# Patient Record
Sex: Female | Born: 1950 | Race: White | Hispanic: No | Smoking: Never smoker
Health system: Southern US, Community
[De-identification: ages and names within clinical notes are randomized; demographics above are authoritative.]

## PROBLEM LIST (undated history)

## (undated) DIAGNOSIS — I10 Essential (primary) hypertension: Secondary | ICD-10-CM

---

## 1999-04-20 ENCOUNTER — Encounter: Payer: Self-pay | Admitting: Neurosurgery

## 1999-04-20 ENCOUNTER — Inpatient Hospital Stay (HOSPITAL_COMMUNITY): Admission: RE | Admit: 1999-04-20 | Discharge: 1999-04-21 | Payer: Self-pay | Admitting: Neurosurgery

## 1999-05-31 ENCOUNTER — Encounter: Payer: Self-pay | Admitting: Neurosurgery

## 1999-05-31 ENCOUNTER — Encounter: Admission: RE | Admit: 1999-05-31 | Discharge: 1999-05-31 | Payer: Self-pay | Admitting: Neurosurgery

## 2017-03-26 ENCOUNTER — Emergency Department
Admission: EM | Admit: 2017-03-26 | Discharge: 2017-03-26 | Disposition: A | Discharge status: Home / Self Care | Payer: BLUE CROSS/BLUE SHIELD | Attending: Emergency Medicine | Admitting: Emergency Medicine

## 2017-03-26 ENCOUNTER — Emergency Department: Payer: BLUE CROSS/BLUE SHIELD

## 2017-03-26 ENCOUNTER — Encounter: Payer: Self-pay | Admitting: Emergency Medicine

## 2017-03-26 DIAGNOSIS — N12 Tubulo-interstitial nephritis, not specified as acute or chronic: Secondary | ICD-10-CM

## 2017-03-26 DIAGNOSIS — N10 Acute tubulo-interstitial nephritis: Secondary | ICD-10-CM | POA: Diagnosis not present

## 2017-03-26 DIAGNOSIS — R1030 Lower abdominal pain, unspecified: Secondary | ICD-10-CM | POA: Diagnosis not present

## 2017-03-26 DIAGNOSIS — R112 Nausea with vomiting, unspecified: Secondary | ICD-10-CM | POA: Diagnosis present

## 2017-03-26 LAB — COMPREHENSIVE METABOLIC PANEL
ALBUMIN: 3 g/dL — AB (ref 3.5–5.0)
ALT: 24 U/L (ref 14–54)
AST: 23 U/L (ref 15–41)
Alkaline Phosphatase: 78 U/L (ref 38–126)
Anion gap: 11 (ref 5–15)
BILIRUBIN TOTAL: 0.8 mg/dL (ref 0.3–1.2)
BUN: 23 mg/dL — AB (ref 6–20)
CO2: 22 mmol/L (ref 22–32)
Calcium: 8.7 mg/dL — ABNORMAL LOW (ref 8.9–10.3)
Chloride: 100 mmol/L — ABNORMAL LOW (ref 101–111)
Creatinine, Ser: 0.93 mg/dL (ref 0.44–1.00)
GFR calc Af Amer: >60 mL/min (ref >60)
GFR calc non Af Amer: >60 mL/min (ref >60)
GLUCOSE: 178 mg/dL — AB (ref 65–99)
POTASSIUM: 3.6 mmol/L (ref 3.5–5.1)
Sodium: 133 mmol/L — ABNORMAL LOW (ref 135–145)
TOTAL PROTEIN: 7.1 g/dL (ref 6.5–8.1)

## 2017-03-26 LAB — CBC
HEMATOCRIT: 43.3 % (ref 35.0–47.0)
Hemoglobin: 14.5 g/dL (ref 12.0–16.0)
MCH: 30.7 pg (ref 26.0–34.0)
MCHC: 33.5 g/dL (ref 32.0–36.0)
MCV: 91.5 fL (ref 80.0–100.0)
PLATELETS: 276 10*3/uL (ref 150–440)
RBC: 4.73 MIL/uL (ref 3.80–5.20)
RDW: 12.1 % (ref 11.5–14.5)
WBC: 19.9 10*3/uL — ABNORMAL HIGH (ref 3.6–11.0)

## 2017-03-26 LAB — URINALYSIS, COMPLETE (UACMP) WITH MICROSCOPIC
Bilirubin Urine: NEGATIVE
GLUCOSE, UA: NEGATIVE mg/dL
Ketones, ur: 5 mg/dL — AB
NITRITE: NEGATIVE
PH: 6 (ref 5.0–8.0)
PROTEIN: 100 mg/dL — AB
Specific Gravity, Urine: 1.012 (ref 1.005–1.030)
Squamous Epithelial / LPF: NONE SEEN

## 2017-03-26 LAB — LIPASE, BLOOD: Lipase: 14 U/L (ref 11–51)

## 2017-03-26 MED ORDER — DEXTROSE 5 % IV SOLN
1.0000 g | Freq: Once | INTRAVENOUS | Status: AC
  Administered 2017-03-26: 1 g via INTRAVENOUS
  Filled 2017-03-26: qty 10

## 2017-03-26 MED ORDER — ONDANSETRON 4 MG PO TBDP
ORAL_TABLET | ORAL | Status: AC
  Administered 2017-03-26: 4 mg via ORAL
  Filled 2017-03-26: qty 1

## 2017-03-26 MED ORDER — DEXTROSE 5 % IV SOLN: 1.0000 g | Freq: Once | INTRAVENOUS | Status: DC

## 2017-03-26 MED ORDER — CEFTRIAXONE SODIUM IN DEXTROSE 20 MG/ML IV SOLN
INTRAVENOUS | Status: AC
  Filled 2017-03-26: qty 50

## 2017-03-26 MED ORDER — ONDANSETRON 4 MG PO TBDP
4.0000 mg | ORAL_TABLET | Freq: Once | ORAL | Status: AC
  Administered 2017-03-26: 4 mg via ORAL

## 2017-03-26 MED ORDER — IOPAMIDOL (ISOVUE-300) INJECTION 61%
100.0000 mL | Freq: Once | INTRAVENOUS | Status: AC | PRN
  Administered 2017-03-26: 100 mL via INTRAVENOUS
  Filled 2017-03-26: qty 100

## 2017-03-26 MED ORDER — ONDANSETRON 4 MG PO TBDP
4.0000 mg | ORAL_TABLET | Freq: Once | ORAL | Status: AC | PRN
  Administered 2017-03-26: 4 mg via ORAL
  Filled 2017-03-26: qty 1

## 2017-03-26 MED ORDER — ONDANSETRON HCL 4 MG/2ML IJ SOLN
4.0000 mg | Freq: Once | INTRAMUSCULAR | Status: AC
  Administered 2017-03-26: 4 mg via INTRAVENOUS
  Filled 2017-03-26: qty 2

## 2017-03-26 MED ORDER — CEFDINIR 300 MG PO CAPS: 300.0000 mg | ORAL_CAPSULE | Freq: Two times a day (BID) | ORAL | 0 refills | Status: DC

## 2017-03-26 MED ORDER — DEXTROSE 5 % IV SOLN
2.0000 g | Freq: Once | INTRAVENOUS | Status: DC
  Filled 2017-03-26: qty 2

## 2017-03-26 MED ORDER — SODIUM CHLORIDE 0.9 % IV BOLUS (SEPSIS)
1000.0000 mL | Freq: Once | INTRAVENOUS | Status: AC
  Administered 2017-03-26: 1000 mL via INTRAVENOUS

## 2017-03-26 MED ORDER — ONDANSETRON HCL 4 MG PO TABS: 4.0000 mg | ORAL_TABLET | Freq: Every day | ORAL | 0 refills | Status: DC | PRN

## 2017-03-26 NOTE — ED Notes (Addendum)
MD Schaevitz at bedside. 

## 2017-03-26 NOTE — ED Notes (Signed)
Eileen StanfordJenna RN states she called patient's nephew to pick up patient, patient will be DC to lobby

## 2017-03-26 NOTE — ED Provider Notes (Addendum)
[facility] Emergency Department Provider Note  ____________________________________________   First MD Initiated Contact with Patient 03/26/17 1857     (approximate)  I have reviewed the triage vital signs and the nursing notes.   HISTORY  Chief Complaint Abdominal Pain and Emesis   HPI Paige Howard is a 66 y.o. female who is presenting to the emergency department with 4 days of nausea vomiting.  She says that she occasionally feels a lower abdominal burning in and around the time that she vomits.  However, denies any lower abdominal pain.  Denies any burning with urination.  Says that she is also had a dry cough and intermittent fever.  Got her flu vaccine 1 month ago.  Denies any known sick contacts.  Not reporting any back pain.  History reviewed. No pertinent past medical history.  There are no active problems to display for this patient.   History reviewed. No pertinent surgical history.  Prior to Admission medications   Not on File    Allergies Patient has no known allergies.  No family history on file.  Social History Social History   Tobacco Use  . Smoking status: Never Smoker  . Smokeless tobacco: Never Used  Substance Use Topics  . Alcohol use: No    Frequency: Never  . Drug use: No    Review of Systems  Constitutional: No fever/chills Eyes: No visual changes. ENT: No sore throat. Cardiovascular: Denies chest pain. Respiratory: Denies shortness of breath. Gastrointestinal:   No diarrhea.  No constipation. Genitourinary: Negative for dysuria. Musculoskeletal: Negative for back pain. Skin: Negative for rash. Neurological: Negative for headaches, focal weakness or numbness.   ____________________________________________   PHYSICAL EXAM:  VITAL SIGNS: ED Triage Vitals  Enc Vitals Group     BP 03/26/17 1702 (!) 146/77     Pulse Rate 03/26/17 1702 (!) 106     Resp 03/26/17 1702 16     Temp 03/26/17 1702 100.1  F (37.8 C)     Temp Source 03/26/17 1702 Oral     SpO2 03/26/17 1702 94 %     Weight 03/26/17 1703 200 lb (90.7 kg)     Height 03/26/17 1703 5\' 7"  (1.702 m)     Head Circumference --      Peak Flow --      Pain Score 03/26/17 1702 10     Pain Loc --      Pain Edu? --      Excl. in GC? --     Constitutional: Alert and oriented. Well appearing and in no acute distress. Eyes: Conjunctivae are normal.  Head: Atraumatic. Nose: No congestion/rhinnorhea. Mouth/Throat: Mucous membranes are moist.  Neck: No stridor.   Cardiovascular: Normal rate, regular rhythm. Grossly normal heart sounds.   Respiratory: Normal respiratory effort.  No retractions. Lungs CTAB. Gastrointestinal: Soft and nontender. No distention.  Musculoskeletal: No lower extremity tenderness nor edema.  No joint effusions. Neurologic:  Normal speech and language. No gross focal neurologic deficits are appreciated. Skin:  Skin is warm, dry and intact. No rash noted. Psychiatric: Mood and affect are normal. Speech and behavior are normal.  ____________________________________________   LABS (all labs ordered are listed, but only abnormal results are displayed)  Labs Reviewed  COMPREHENSIVE METABOLIC PANEL - Abnormal; Notable for the following components:      Result Value   Sodium 133 (*)    Chloride 100 (*)    Glucose, Bld 178 (*)    BUN 23 (*)  Calcium 8.7 (*)    Albumin 3.0 (*)    All other components within normal limits  CBC - Abnormal; Notable for the following components:   WBC 19.9 (*)    All other components within normal limits  URINALYSIS, COMPLETE (UACMP) WITH MICROSCOPIC - Abnormal; Notable for the following components:   Color, Urine YELLOW (*)    APPearance HAZY (*)    Hgb urine dipstick MODERATE (*)    Ketones, ur 5 (*)    Protein, ur 100 (*)    Leukocytes, UA MODERATE (*)    Bacteria, UA MANY (*)    All other components within normal limits  URINE CULTURE  LIPASE, BLOOD    ____________________________________________  EKG   ____________________________________________  RADIOLOGY  CAT scan with radiographic evidence of right-sided pyelonephritis. ____________________________________________   PROCEDURES  Procedure(s) performed:   Procedures  Critical Care performed:   ____________________________________________   INITIAL IMPRESSION / ASSESSMENT AND PLAN / ED COURSE  Pertinent labs & imaging results that were available during my care of the patient were reviewed by me and considered in my medical decision making (see chart for details).  DDX: Viral syndrome, viral gastroenteritis, dehydration  As part of my medical decision making, I reviewed the following data within the electronic MEDICAL RECORD NUMBER Old chart reviewed  Patient without any lower abdominal tenderness to palpation.  She repeatedly denies any lower abdominal tenderness as I palpated multiple times to both superficial middle and deep palpation.  At this point given her exam it seems unlikely that the patient will be having an acute surgical intra-abdominal issue even given her elevated white blood cell count.  Possible reactive white blood cell count secondary to this viral issue.    ----------------------------------------- 10:43 PM on 03/26/2017 -----------------------------------------  Patient now tolerating ginger ale.  She will receive a dose of IV ceftriaxone in the emergency department and be discharged home with p.o. Cefdinir as well as Zofran.  Patient knows to return for any worsening or concerning symptoms.  She is understanding of the diagnosis as well as the treatment plan willing to comply.  She is unconvinced that this is all from urinary tract infection.  It is possible that she may have a viral infection as well.  She is denying any right flank pain.  No CVA tenderness to palpation.  ____________________________________________   FINAL CLINICAL IMPRESSION(S)  / ED DIAGNOSES  Pyelonephritis.  Nausea and vomiting.    NEW MEDICATIONS STARTED DURING THIS VISIT:  This SmartLink is deprecated. Use AVSMEDLIST instead to display the medication list for a patient.   Note:  This document was prepared using Dragon voice recognition software and may include unintentional dictation errors.     Myrna Blazer, MD 03/26/17 2245  Temperature retaken and is 98.5.  Patient is nontoxic-appearing.  I feel that she is appropriate for home treatment.    Myrna Blazer, MD 03/26/17 [id number]

## 2017-03-26 NOTE — ED Triage Notes (Signed)
Pt states that since Thursday she has been having N/V/ fever, chills, body aches, headache. Pt denies any sick contacts. Pt states that she is now having bilateral lower abdominal "burning". Pt in NAD at this time.

## 2017-03-26 NOTE — ED Notes (Signed)
Patient given PO fluids and crackers and encouraged to eat/drink for PO challenge per MD Schaevitz. RN will continue to monitor.

## 2017-03-29 LAB — URINE CULTURE: Culture: >=100000 — AB

## 2017-05-18 ENCOUNTER — Other Ambulatory Visit: Payer: Self-pay | Admitting: Family Medicine

## 2017-05-18 DIAGNOSIS — Z1231 Encounter for screening mammogram for malignant neoplasm of breast: Secondary | ICD-10-CM

## 2017-06-08 ENCOUNTER — Other Ambulatory Visit: Payer: Self-pay | Admitting: Family Medicine

## 2017-06-08 ENCOUNTER — Ambulatory Visit
Admission: RE | Admit: 2017-06-08 | Discharge: 2017-06-08 | Disposition: A | Discharge status: Home / Self Care | Payer: BLUE CROSS/BLUE SHIELD | Source: Ambulatory Visit | Attending: Family Medicine | Admitting: Family Medicine

## 2017-06-08 DIAGNOSIS — Z1231 Encounter for screening mammogram for malignant neoplasm of breast: Secondary | ICD-10-CM

## 2018-06-18 ENCOUNTER — Other Ambulatory Visit: Payer: Self-pay | Admitting: Family Medicine

## 2018-06-18 DIAGNOSIS — Z1231 Encounter for screening mammogram for malignant neoplasm of breast: Secondary | ICD-10-CM

## 2018-07-03 ENCOUNTER — Ambulatory Visit
Admission: RE | Admit: 2018-07-03 | Discharge: 2018-07-03 | Disposition: A | Discharge status: Home / Self Care | Payer: BLUE CROSS/BLUE SHIELD | Source: Ambulatory Visit | Attending: Family Medicine | Admitting: Family Medicine

## 2018-07-03 DIAGNOSIS — Z1231 Encounter for screening mammogram for malignant neoplasm of breast: Secondary | ICD-10-CM | POA: Diagnosis not present

## 2018-08-29 ENCOUNTER — Ambulatory Visit: Admit: 2018-08-29 | Payer: BLUE CROSS/BLUE SHIELD | Admitting: Ophthalmology

## 2018-08-29 SURGERY — PHACOEMULSIFICATION, CATARACT, WITH IOL INSERTION
Anesthesia: Choice | Laterality: Right

## 2018-10-25 ENCOUNTER — Encounter: Payer: Self-pay | Admitting: *Deleted

## 2018-10-25 ENCOUNTER — Other Ambulatory Visit: Payer: Self-pay

## 2018-10-25 NOTE — Discharge Instructions (Signed)

## 2018-10-26 ENCOUNTER — Encounter
Admission: RE | Admit: 2018-10-26 | Discharge: 2018-10-26 | Disposition: A | Discharge status: Home / Self Care | Payer: Medicare Other | Source: Ambulatory Visit | Attending: Ophthalmology | Admitting: Ophthalmology

## 2018-10-26 DIAGNOSIS — Z01812 Encounter for preprocedural laboratory examination: Secondary | ICD-10-CM | POA: Diagnosis not present

## 2018-10-26 DIAGNOSIS — Z1159 Encounter for screening for other viral diseases: Secondary | ICD-10-CM | POA: Insufficient documentation

## 2018-10-27 LAB — NOVEL CORONAVIRUS, NAA (HOSP ORDER, SEND-OUT TO REF LAB; TAT 18-24 HRS): SARS-CoV-2, NAA: NOT DETECTED

## 2018-10-31 ENCOUNTER — Ambulatory Visit: Payer: BLUE CROSS/BLUE SHIELD | Admitting: Anesthesiology

## 2018-10-31 ENCOUNTER — Encounter
Admission: RE | Disposition: A | Discharge status: Home / Self Care | Payer: Self-pay | Source: Home / Self Care | Attending: Ophthalmology

## 2018-10-31 ENCOUNTER — Ambulatory Visit
Admission: RE | Admit: 2018-10-31 | Discharge: 2018-10-31 | Disposition: A | Discharge status: Home / Self Care | Payer: BLUE CROSS/BLUE SHIELD | Attending: Ophthalmology | Admitting: Ophthalmology

## 2018-10-31 DIAGNOSIS — H2511 Age-related nuclear cataract, right eye: Secondary | ICD-10-CM | POA: Insufficient documentation

## 2018-10-31 DIAGNOSIS — F329 Major depressive disorder, single episode, unspecified: Secondary | ICD-10-CM | POA: Insufficient documentation

## 2018-10-31 DIAGNOSIS — F419 Anxiety disorder, unspecified: Secondary | ICD-10-CM | POA: Insufficient documentation

## 2018-10-31 DIAGNOSIS — I10 Essential (primary) hypertension: Secondary | ICD-10-CM | POA: Insufficient documentation

## 2018-10-31 DIAGNOSIS — Z79899 Other long term (current) drug therapy: Secondary | ICD-10-CM | POA: Insufficient documentation

## 2018-10-31 DIAGNOSIS — K219 Gastro-esophageal reflux disease without esophagitis: Secondary | ICD-10-CM | POA: Diagnosis not present

## 2018-10-31 HISTORY — DX: Essential (primary) hypertension: I10

## 2018-10-31 HISTORY — PX: CATARACT EXTRACTION W/PHACO: SHX586

## 2018-10-31 SURGERY — PHACOEMULSIFICATION, CATARACT, WITH IOL INSERTION
Anesthesia: Monitor Anesthesia Care | Site: Eye | Laterality: Right

## 2018-10-31 MED ORDER — MOXIFLOXACIN HCL 0.5 % OP SOLN
1.0000 [drp] | OPHTHALMIC | Status: DC | PRN
  Administered 2018-10-31 (×3): 1 [drp] via OPHTHALMIC

## 2018-10-31 MED ORDER — ARMC OPHTHALMIC DILATING DROPS
1.0000 "application " | OPHTHALMIC | Status: DC | PRN
  Administered 2018-10-31 (×3): 1 via OPHTHALMIC

## 2018-10-31 MED ORDER — ACETAMINOPHEN 325 MG PO TABS: 325.0000 mg | ORAL_TABLET | Freq: Once | ORAL | Status: DC

## 2018-10-31 MED ORDER — FENTANYL CITRATE (PF) 100 MCG/2ML IJ SOLN
INTRAMUSCULAR | Status: DC | PRN
  Administered 2018-10-31: 50 ug via INTRAVENOUS

## 2018-10-31 MED ORDER — CEFUROXIME OPHTHALMIC INJECTION 1 MG/0.1 ML
INJECTION | OPHTHALMIC | Status: DC | PRN
  Administered 2018-10-31: 0.1 mL via INTRACAMERAL

## 2018-10-31 MED ORDER — BRIMONIDINE TARTRATE-TIMOLOL 0.2-0.5 % OP SOLN
OPHTHALMIC | Status: DC | PRN
  Administered 2018-10-31: 1 [drp] via OPHTHALMIC

## 2018-10-31 MED ORDER — EPINEPHRINE PF 1 MG/ML IJ SOLN
INTRAOCULAR | Status: DC | PRN
  Administered 2018-10-31: 70 mL via OPHTHALMIC

## 2018-10-31 MED ORDER — MIDAZOLAM HCL 2 MG/2ML IJ SOLN
INTRAMUSCULAR | Status: DC | PRN
  Administered 2018-10-31: 2 mg via INTRAVENOUS
  Administered 2018-10-31: 1 mg via INTRAVENOUS

## 2018-10-31 MED ORDER — LIDOCAINE HCL (PF) 2 % IJ SOLN
INTRAOCULAR | Status: DC | PRN
  Administered 2018-10-31: 1 mL

## 2018-10-31 MED ORDER — LACTATED RINGERS IV SOLN: 10.0000 mL/h | INTRAVENOUS | Status: DC

## 2018-10-31 MED ORDER — NA HYALUR & NA CHOND-NA HYALUR 0.4-0.35 ML IO KIT
PACK | INTRAOCULAR | Status: DC | PRN
  Administered 2018-10-31: 1 mL via INTRAOCULAR

## 2018-10-31 MED ORDER — TETRACAINE HCL 0.5 % OP SOLN
1.0000 [drp] | OPHTHALMIC | Status: DC | PRN
  Administered 2018-10-31 (×3): 1 [drp] via OPHTHALMIC

## 2018-10-31 MED ORDER — ACETAMINOPHEN 160 MG/5ML PO SOLN: 325.0000 mg | Freq: Once | ORAL | Status: DC

## 2018-10-31 SURGICAL SUPPLY — 19 items
CANNULA ANT/CHMB 27GA (MISCELLANEOUS) ×2 IMPLANT
GLOVE SURG LX 7.5 STRW (GLOVE) ×1
GLOVE SURG LX STRL 7.5 STRW (GLOVE) ×1 IMPLANT
GLOVE SURG TRIUMPH 8.0 PF LTX (GLOVE) ×2 IMPLANT
GOWN STRL REUS W/ TWL LRG LVL3 (GOWN DISPOSABLE) ×2 IMPLANT
GOWN STRL REUS W/TWL LRG LVL3 (GOWN DISPOSABLE) ×2
LENS IOL ACRSF IQ ULTRA 18.0 (Intraocular Lens) ×1 IMPLANT
LENS IOL ACRYSOF IQ 18.0 (Intraocular Lens) ×2 IMPLANT
MARKER SKIN DUAL TIP RULER LAB (MISCELLANEOUS) ×2 IMPLANT
NEEDLE FILTER BLUNT 18X 1/2SAF (NEEDLE) ×1
NEEDLE FILTER BLUNT 18X1 1/2 (NEEDLE) ×1 IMPLANT
PACK CATARACT BRASINGTON (MISCELLANEOUS) ×2 IMPLANT
PACK EYE AFTER SURG (MISCELLANEOUS) ×2 IMPLANT
PACK OPTHALMIC (MISCELLANEOUS) ×2 IMPLANT
SYR 3ML LL SCALE MARK (SYRINGE) ×2 IMPLANT
SYR 5ML LL (SYRINGE) ×2 IMPLANT
SYR TB 1ML LUER SLIP (SYRINGE) ×2 IMPLANT
WATER STERILE IRR 500ML POUR (IV SOLUTION) ×2 IMPLANT
WIPE NON LINTING 3.25X3.25 (MISCELLANEOUS) ×2 IMPLANT

## 2018-10-31 NOTE — Anesthesia Postprocedure Evaluation (Signed)
Anesthesia Post Note  Patient: Paige Howard  Procedure(s) Performed: CATARACT EXTRACTION PHACO AND INTRAOCULAR LENS PLACEMENT (IOC)  RIGHT (Right Eye)  Patient location during evaluation: PACU Anesthesia Type: MAC Level of consciousness: awake and alert and oriented Pain management: satisfactory to patient Vital Signs Assessment: post-procedure vital signs reviewed and stable Respiratory status: spontaneous breathing, nonlabored ventilation and respiratory function stable Cardiovascular status: blood pressure returned to baseline and stable Postop Assessment: Adequate PO intake and No signs of nausea or vomiting Anesthetic complications: no    Raliegh Ip

## 2018-10-31 NOTE — H&P (Signed)

## 2018-10-31 NOTE — Transfer of Care (Signed)
Immediate Anesthesia Transfer of Care Note  Patient: Paige Howard  Procedure(s) Performed: CATARACT EXTRACTION PHACO AND INTRAOCULAR LENS PLACEMENT (IOC)  RIGHT (Right Eye)  Patient Location: PACU  Anesthesia Type: MAC  Level of Consciousness: awake, alert  and patient cooperative  Airway and Oxygen Therapy: Patient Spontanous Breathing and Patient connected to supplemental oxygen  Post-op Assessment: Post-op Vital signs reviewed, Patient's Cardiovascular Status Stable, Respiratory Function Stable, Patent Airway and No signs of Nausea or vomiting  Post-op Vital Signs: Reviewed and stable  Complications: No apparent anesthesia complications

## 2018-10-31 NOTE — Anesthesia Preprocedure Evaluation (Signed)
Anesthesia Evaluation  Patient identified by MRN, date of birth, ID band Patient awake    Reviewed: Allergy & Precautions, H&P , NPO status , Patient's Chart, lab work & pertinent test results  Airway Mallampati: II  TM Distance: >3 FB Neck ROM: full    Dental no notable dental hx.    Pulmonary    Pulmonary exam normal breath sounds clear to auscultation       Cardiovascular hypertension, Normal cardiovascular exam Rhythm:regular Rate:Normal     Neuro/Psych    GI/Hepatic   Endo/Other    Renal/GU      Musculoskeletal   Abdominal   Peds  Hematology   Anesthesia Other Findings   Reproductive/Obstetrics                             Anesthesia Physical Anesthesia Plan  ASA: II  Anesthesia Plan: MAC   Post-op Pain Management:    Induction:   PONV Risk Score and Plan: 2 and Midazolam  Airway Management Planned:   Additional Equipment:   Intra-op Plan:   Post-operative Plan:   Informed Consent: I have reviewed the patients History and Physical, chart, labs and discussed the procedure including the risks, benefits and alternatives for the proposed anesthesia with the patient or authorized representative who has indicated his/her understanding and acceptance.       Plan Discussed with: CRNA  Anesthesia Plan Comments:         Anesthesia Quick Evaluation

## 2018-10-31 NOTE — Anesthesia Procedure Notes (Signed)
Procedure Name: MAC Date/Time: 10/31/2018 8:30 AM Performed by: Janna Arch, CRNA Pre-anesthesia Checklist: Patient identified, Emergency Drugs available, Suction available, Timeout performed and Patient being monitored Patient Re-evaluated:Patient Re-evaluated prior to induction Oxygen Delivery Method: Nasal cannula Placement Confirmation: positive ETCO2

## 2018-10-31 NOTE — Op Note (Signed)
LOCATION:  [facility]   PREOPERATIVE DIAGNOSIS:    Nuclear sclerotic cataract right eye. H25.11   POSTOPERATIVE DIAGNOSIS:  Nuclear sclerotic cataract right eye.     PROCEDURE:  Phacoemusification with posterior chamber intraocular lens placement of the right eye   LENS:   Implant Name Type Inv. Item Serial No. Manufacturer Lot No. LRB No. Used  LENS IOL ACRYSOF IQ 18.0 - [id number] Intraocular Lens LENS IOL ACRYSOF IQ 18.0 [id number] ALCON  Right 1        ULTRASOUND TIME: 1/8 % of 1 minutes, 39 seconds.  CDE 18.1   SURGEON:  Wyonia Hough, MD   ANESTHESIA:  Topical with tetracaine drops and 2% Xylocaine jelly, augmented with 1% preservative-free intracameral lidocaine.    COMPLICATIONS:  None.   DESCRIPTION OF PROCEDURE:  The patient was identified in the holding room and transported to the operating room and placed in the supine position under the operating microscope.  The right eye was identified as the operative eye and it was prepped and draped in the usual sterile ophthalmic fashion.   A 1 millimeter clear-corneal paracentesis was made at the 12:00 position.  0.5 ml of preservative-free 1% lidocaine was injected into the anterior chamber. The anterior chamber was filled with Viscoat viscoelastic.  A 2.4 millimeter keratome was used to make a near-clear corneal incision at the 9:00 position.  A curvilinear capsulorrhexis was made with a cystotome and capsulorrhexis forceps.  Balanced salt solution was used to hydrodissect and hydrodelineate the nucleus.   Phacoemulsification was then used in stop and chop fashion to remove the lens nucleus and epinucleus.  The remaining cortex was then removed using the irrigation and aspiration handpiece. Provisc was then placed into the capsular bag to distend it for lens placement.  A lens was then injected into the capsular bag.  The remaining viscoelastic was aspirated.   Wounds were hydrated with balanced salt solution.   The anterior chamber was inflated to a physiologic pressure with balanced salt solution.  No wound leaks were noted. Cefuroxime 0.1 ml of a 10mg /ml solution was injected into the anterior chamber for a dose of 1 mg of intracameral antibiotic at the completion of the case.   Timolol and Brimonidine drops were applied to the eye.  The patient was taken to the recovery room in stable condition without complications of anesthesia or surgery.   Jamaurion Slemmer 10/31/2018, 8:52 AM

## 2018-11-01 ENCOUNTER — Encounter: Payer: Self-pay | Admitting: Ophthalmology

## 2018-11-21 ENCOUNTER — Encounter: Payer: Self-pay | Admitting: *Deleted

## 2018-11-21 ENCOUNTER — Other Ambulatory Visit: Payer: Self-pay

## 2018-11-23 ENCOUNTER — Other Ambulatory Visit
Admission: RE | Admit: 2018-11-23 | Discharge: 2018-11-23 | Disposition: A | Discharge status: Home / Self Care | Payer: BC Managed Care – PPO | Source: Ambulatory Visit | Attending: Ophthalmology | Admitting: Ophthalmology

## 2018-11-23 ENCOUNTER — Other Ambulatory Visit: Payer: Self-pay

## 2018-11-23 DIAGNOSIS — Z1159 Encounter for screening for other viral diseases: Secondary | ICD-10-CM | POA: Insufficient documentation

## 2018-11-23 DIAGNOSIS — Z01812 Encounter for preprocedural laboratory examination: Secondary | ICD-10-CM | POA: Diagnosis not present

## 2018-11-23 LAB — SARS CORONAVIRUS 2 (TAT 6-24 HRS): SARS Coronavirus 2: NEGATIVE

## 2018-11-24 NOTE — Anesthesia Preprocedure Evaluation (Addendum)
Anesthesia Evaluation  Patient identified by MRN, date of birth, ID band Patient awake    Reviewed: Allergy & Precautions, NPO status , Patient's Chart, lab work & pertinent test results  History of Anesthesia Complications Negative for: history of anesthetic complications  Airway Mallampati: III   Neck ROM: Full    Dental no notable dental hx.    Pulmonary neg pulmonary ROS,    Pulmonary exam normal breath sounds clear to auscultation       Cardiovascular hypertension, Normal cardiovascular exam Rhythm:Regular Rate:Normal     Neuro/Psych negative neurological ROS     GI/Hepatic negative GI ROS,   Endo/Other  negative endocrine ROS  Renal/GU negative Renal ROS     Musculoskeletal   Abdominal   Peds  Hematology negative hematology ROS (+)   Anesthesia Other Findings   Reproductive/Obstetrics                            Anesthesia Physical Anesthesia Plan  ASA: II  Anesthesia Plan: MAC   Post-op Pain Management:    Induction: Intravenous  PONV Risk Score and Plan: 2 and TIVA and Midazolam  Airway Management Planned: Natural Airway  Additional Equipment:   Intra-op Plan:   Post-operative Plan:   Informed Consent: I have reviewed the patients History and Physical, chart, labs and discussed the procedure including the risks, benefits and alternatives for the proposed anesthesia with the patient or authorized representative who has indicated his/her understanding and acceptance.       Plan Discussed with: CRNA  Anesthesia Plan Comments:        Anesthesia Quick Evaluation

## 2018-11-26 NOTE — Discharge Instructions (Signed)

## 2018-11-28 ENCOUNTER — Encounter
Admission: RE | Disposition: A | Discharge status: Home / Self Care | Payer: Self-pay | Source: Home / Self Care | Attending: Ophthalmology

## 2018-11-28 ENCOUNTER — Ambulatory Visit: Payer: BC Managed Care – PPO | Admitting: Anesthesiology

## 2018-11-28 ENCOUNTER — Ambulatory Visit
Admission: RE | Admit: 2018-11-28 | Discharge: 2018-11-28 | Disposition: A | Discharge status: Home / Self Care | Payer: BC Managed Care – PPO | Attending: Ophthalmology | Admitting: Ophthalmology

## 2018-11-28 DIAGNOSIS — F329 Major depressive disorder, single episode, unspecified: Secondary | ICD-10-CM | POA: Insufficient documentation

## 2018-11-28 DIAGNOSIS — I10 Essential (primary) hypertension: Secondary | ICD-10-CM | POA: Insufficient documentation

## 2018-11-28 DIAGNOSIS — H2512 Age-related nuclear cataract, left eye: Secondary | ICD-10-CM | POA: Insufficient documentation

## 2018-11-28 DIAGNOSIS — F419 Anxiety disorder, unspecified: Secondary | ICD-10-CM | POA: Insufficient documentation

## 2018-11-28 DIAGNOSIS — Z9849 Cataract extraction status, unspecified eye: Secondary | ICD-10-CM | POA: Insufficient documentation

## 2018-11-28 HISTORY — PX: CATARACT EXTRACTION W/PHACO: SHX586

## 2018-11-28 SURGERY — PHACOEMULSIFICATION, CATARACT, WITH IOL INSERTION
Anesthesia: Monitor Anesthesia Care | Site: Eye | Laterality: Left

## 2018-11-28 MED ORDER — ARMC OPHTHALMIC DILATING DROPS
1.0000 "application " | OPHTHALMIC | Status: DC | PRN
  Administered 2018-11-28 (×3): 1 via OPHTHALMIC

## 2018-11-28 MED ORDER — LIDOCAINE HCL (PF) 2 % IJ SOLN
INTRAOCULAR | Status: DC | PRN
  Administered 2018-11-28: 1 mL

## 2018-11-28 MED ORDER — CEFUROXIME OPHTHALMIC INJECTION 1 MG/0.1 ML
INJECTION | OPHTHALMIC | Status: DC | PRN
  Administered 2018-11-28: 0.1 mL via INTRACAMERAL

## 2018-11-28 MED ORDER — NA HYALUR & NA CHOND-NA HYALUR 0.4-0.35 ML IO KIT
PACK | INTRAOCULAR | Status: DC | PRN
  Administered 2018-11-28: 1 mL via INTRAOCULAR

## 2018-11-28 MED ORDER — TETRACAINE HCL 0.5 % OP SOLN
1.0000 [drp] | OPHTHALMIC | Status: DC | PRN
  Administered 2018-11-28 (×3): 1 [drp] via OPHTHALMIC

## 2018-11-28 MED ORDER — FENTANYL CITRATE (PF) 100 MCG/2ML IJ SOLN
INTRAMUSCULAR | Status: DC | PRN
  Administered 2018-11-28 (×2): 50 ug via INTRAVENOUS

## 2018-11-28 MED ORDER — MOXIFLOXACIN HCL 0.5 % OP SOLN
1.0000 [drp] | OPHTHALMIC | Status: DC | PRN
  Administered 2018-11-28 (×3): 1 [drp] via OPHTHALMIC

## 2018-11-28 MED ORDER — MIDAZOLAM HCL 2 MG/2ML IJ SOLN
INTRAMUSCULAR | Status: DC | PRN
  Administered 2018-11-28 (×2): 1 mg via INTRAVENOUS

## 2018-11-28 MED ORDER — BRIMONIDINE TARTRATE-TIMOLOL 0.2-0.5 % OP SOLN
OPHTHALMIC | Status: DC | PRN
  Administered 2018-11-28: 1 [drp] via OPHTHALMIC

## 2018-11-28 MED ORDER — EPINEPHRINE PF 1 MG/ML IJ SOLN
INTRAOCULAR | Status: DC | PRN
  Administered 2018-11-28: 14:00:00 81 mL via OPHTHALMIC

## 2018-11-28 SURGICAL SUPPLY — 21 items
CANNULA ANT/CHMB 27G (MISCELLANEOUS) ×1 IMPLANT
CANNULA ANT/CHMB 27GA (MISCELLANEOUS) ×3 IMPLANT
GLOVE SURG LX 7.5 STRW (GLOVE) ×2
GLOVE SURG LX STRL 7.5 STRW (GLOVE) ×1 IMPLANT
GLOVE SURG TRIUMPH 8.0 PF LTX (GLOVE) ×3 IMPLANT
GOWN STRL REUS W/ TWL LRG LVL3 (GOWN DISPOSABLE) ×2 IMPLANT
GOWN STRL REUS W/TWL LRG LVL3 (GOWN DISPOSABLE) ×4
LENS IOL ACRSF IQ ULTRA 17.0 (Intraocular Lens) IMPLANT
LENS IOL ACRYSOF IQ 17.0 (Intraocular Lens) ×3 IMPLANT
MARKER SKIN DUAL TIP RULER LAB (MISCELLANEOUS) ×3 IMPLANT
NDL FILTER BLUNT 18X1 1/2 (NEEDLE) ×1 IMPLANT
NEEDLE FILTER BLUNT 18X 1/2SAF (NEEDLE) ×2
NEEDLE FILTER BLUNT 18X1 1/2 (NEEDLE) ×1 IMPLANT
PACK CATARACT BRASINGTON (MISCELLANEOUS) ×3 IMPLANT
PACK EYE AFTER SURG (MISCELLANEOUS) ×3 IMPLANT
PACK OPTHALMIC (MISCELLANEOUS) ×3 IMPLANT
SYR 3ML LL SCALE MARK (SYRINGE) ×3 IMPLANT
SYR 5ML LL (SYRINGE) ×3 IMPLANT
SYR TB 1ML LUER SLIP (SYRINGE) ×3 IMPLANT
WATER STERILE IRR 500ML POUR (IV SOLUTION) ×3 IMPLANT
WIPE NON LINTING 3.25X3.25 (MISCELLANEOUS) ×3 IMPLANT

## 2018-11-28 NOTE — Anesthesia Postprocedure Evaluation (Signed)
Anesthesia Post Note  Patient: Paige Howard  Procedure(s) Performed: CATARACT EXTRACTION PHACO AND INTRAOCULAR LENS PLACEMENT (IOC)  LEFT (Left Eye)  Patient location during evaluation: PACU Anesthesia Type: MAC Level of consciousness: awake and alert, oriented and patient cooperative Pain management: pain level controlled Vital Signs Assessment: post-procedure vital signs reviewed and stable Respiratory status: spontaneous breathing, nonlabored ventilation and respiratory function stable Cardiovascular status: blood pressure returned to baseline and stable Postop Assessment: adequate PO intake Anesthetic complications: no    Darrin Nipper

## 2018-11-28 NOTE — Anesthesia Procedure Notes (Signed)
Date/Time: 11/28/2018 1:42 PM Performed by: Cameron Ali, CRNA Pre-anesthesia Checklist: Patient identified, Emergency Drugs available, Suction available, Timeout performed and Patient being monitored Patient Re-evaluated:Patient Re-evaluated prior to induction Oxygen Delivery Method: Nasal cannula Placement Confirmation: positive ETCO2

## 2018-11-28 NOTE — Transfer of Care (Signed)
Immediate Anesthesia Transfer of Care Note  Patient: Paige Howard  Procedure(s) Performed: CATARACT EXTRACTION PHACO AND INTRAOCULAR LENS PLACEMENT (IOC)  LEFT (Left Eye)  Patient Location: PACU  Anesthesia Type: MAC  Level of Consciousness: awake, alert  and patient cooperative  Airway and Oxygen Therapy: Patient Spontanous Breathing and Patient connected to supplemental oxygen  Post-op Assessment: Post-op Vital signs reviewed, Patient's Cardiovascular Status Stable, Respiratory Function Stable, Patent Airway and No signs of Nausea or vomiting  Post-op Vital Signs: Reviewed and stable  Complications: No apparent anesthesia complications

## 2018-11-28 NOTE — Op Note (Signed)
OPERATIVE NOTE  Paige Howard [id number] 11/28/2018   PREOPERATIVE DIAGNOSIS:  Nuclear sclerotic cataract left eye. H25.12   POSTOPERATIVE DIAGNOSIS:    Nuclear sclerotic cataract left eye.     PROCEDURE:  Phacoemusification with posterior chamber intraocular lens placement of the left eye   LENS:   Implant Name Type Inv. Item Serial No. Manufacturer Lot No. LRB No. Used Action  LENS IOL ACRYSOF IQ 17.0 - [id number] Intraocular Lens LENS IOL ACRYSOF IQ 17.0 [id number] ALCON  Left 1 Implanted        ULTRASOUND TIME: 16  % of 1 minutes 45 seconds, CDE 17.8  SURGEON:  Wyonia Hough, MD   ANESTHESIA:  Topical with tetracaine drops and 2% Xylocaine jelly, augmented with 1% preservative-free intracameral lidocaine.    COMPLICATIONS:  Posterior capsular tear without vitreous loss..   DESCRIPTION OF PROCEDURE:  The patient was identified in the holding room and transported to the operating room and placed in the supine position under the operating microscope.  The left eye was identified as the operative eye and it was prepped and draped in the usual sterile ophthalmic fashion.   A 1 millimeter clear-corneal paracentesis was made at the 1:30 position.  0.5 ml of preservative-free 1% lidocaine was injected into the anterior chamber.  The anterior chamber was filled with Viscoat viscoelastic.  A 2.4 millimeter keratome was used to make a near-clear corneal incision at the 10:30 position.  .  A curvilinear capsulorrhexis was made with a cystotome and capsulorrhexis forceps.  Balanced salt solution was used to hydrodissect and hydrodelineate the nucleus.   Phacoemulsification was then used in stop and chop fashion to remove the lens nucleus and epinucleus.  The remaining cortex was then removed using the irrigation and aspiration handpiece. Provisc was then placed into the capsular bag to distend it for lens placement.  A lens was then injected into the capsular bag.  After IOL  insertion, a posterior capsule tear was noted.  A balanced salt cannula was used to elevate the optic of the lens into the sulcus for "reverse optic capture."  No vitreous was noted in the anterior chamber or at either incision.   The remaining viscoelastic was aspirated.   Wounds were hydrated with balanced salt solution.  The anterior chamber was inflated to a physiologic pressure with balanced salt solution.  No wound leaks were noted. Cefuroxime 0.1 ml of a 10mg /ml solution was injected into the anterior chamber for a dose of 1 mg of intracameral antibiotic at the completion of the case.   Timolol and Brimonidine drops were applied to the eye.  The patient was taken to the recovery room in stable condition.   Paige Howard 11/28/2018, 2:04 PM

## 2018-11-28 NOTE — H&P (Signed)

## 2018-11-29 ENCOUNTER — Encounter: Payer: Self-pay | Admitting: Ophthalmology

## 2020-06-02 ENCOUNTER — Other Ambulatory Visit: Payer: Self-pay | Admitting: Family Medicine

## 2020-06-02 DIAGNOSIS — Z1231 Encounter for screening mammogram for malignant neoplasm of breast: Secondary | ICD-10-CM

## 2020-06-26 ENCOUNTER — Ambulatory Visit
Admission: RE | Admit: 2020-06-26 | Discharge: 2020-06-26 | Disposition: A | Discharge status: Home / Self Care | Payer: BC Managed Care – PPO | Source: Ambulatory Visit | Attending: Family Medicine | Admitting: Family Medicine

## 2020-06-26 ENCOUNTER — Other Ambulatory Visit: Payer: Self-pay

## 2020-06-26 DIAGNOSIS — Z1231 Encounter for screening mammogram for malignant neoplasm of breast: Secondary | ICD-10-CM | POA: Diagnosis not present

## 2021-12-15 ENCOUNTER — Other Ambulatory Visit: Payer: Self-pay | Admitting: Family Medicine

## 2021-12-15 DIAGNOSIS — Z1231 Encounter for screening mammogram for malignant neoplasm of breast: Secondary | ICD-10-CM

## 2022-01-07 ENCOUNTER — Ambulatory Visit
Admission: RE | Admit: 2022-01-07 | Discharge: 2022-01-07 | Disposition: A | Discharge status: Home / Self Care | Payer: BC Managed Care – PPO | Source: Ambulatory Visit | Attending: Family Medicine | Admitting: Family Medicine

## 2022-01-07 DIAGNOSIS — Z1231 Encounter for screening mammogram for malignant neoplasm of breast: Secondary | ICD-10-CM | POA: Diagnosis not present

## 2022-08-16 IMAGING — MG MM DIGITAL SCREENING BILAT W/ TOMO AND CAD
8 series · 8 of 24 positions shown · non-contrast
Comparison: Previous exam(s).

CLINICAL DATA: Screening.

EXAM:
DIGITAL SCREENING BILATERAL MAMMOGRAM WITH TOMOSYNTHESIS AND CAD
TECHNIQUE: Bilateral screening digital craniocaudal and mediolateral oblique
mammograms were obtained. Bilateral screening digital breast
tomosynthesis was performed. The images were evaluated with
computer-aided detection.

[R MLO synth-2D]
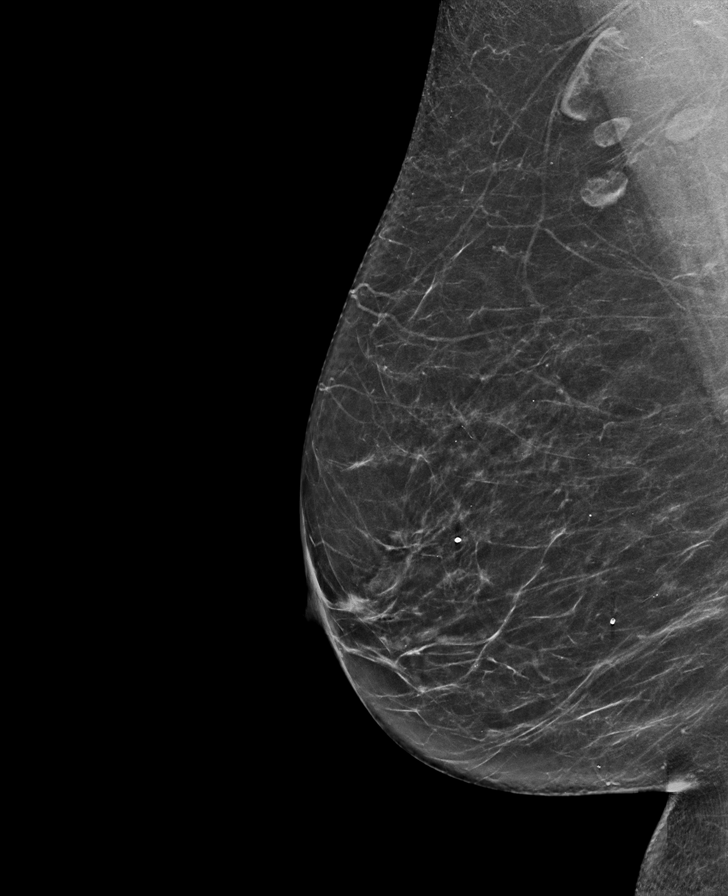

[R CC synth-2D]
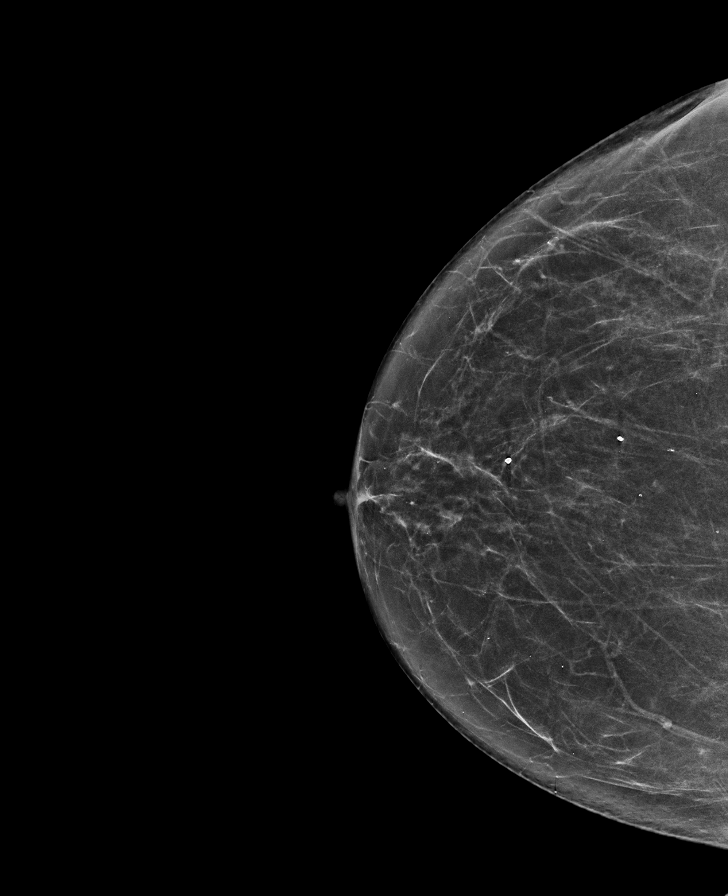

[L MLO synth-2D]
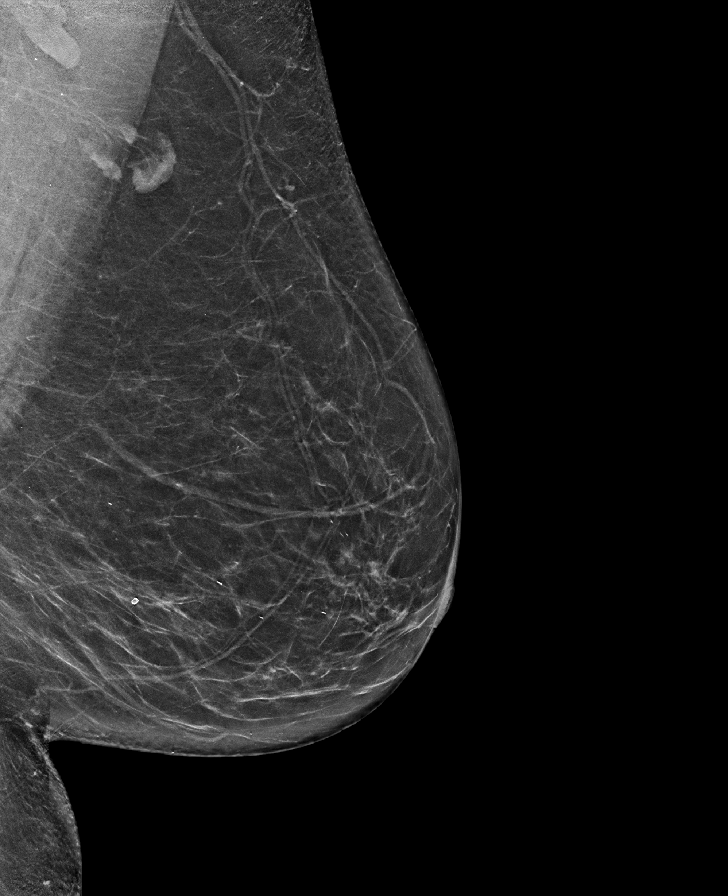

[L CC synth-2D]
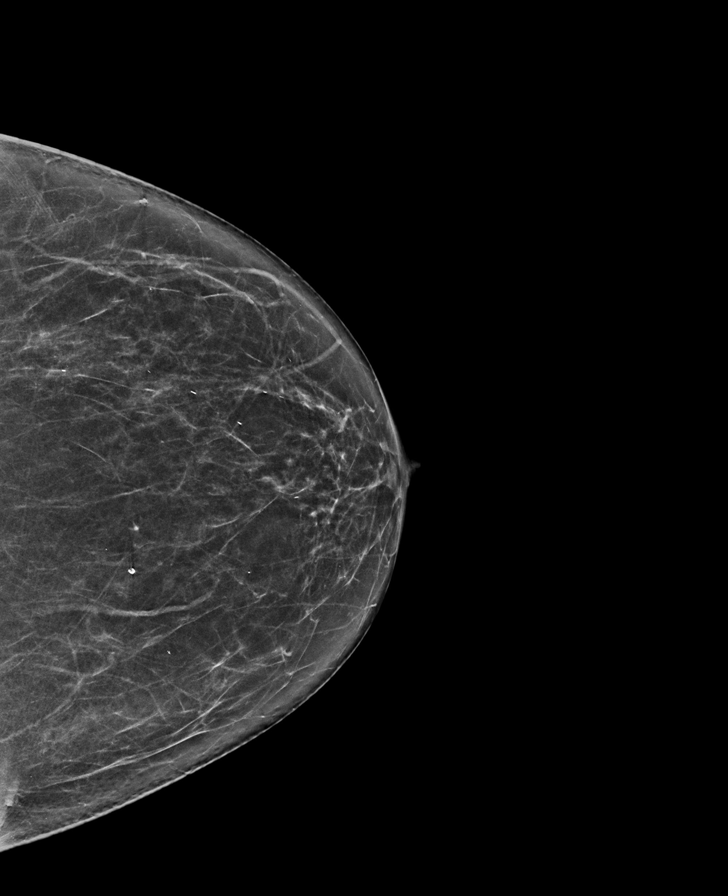

[R CC tomo · tomo slice 36/71.0]
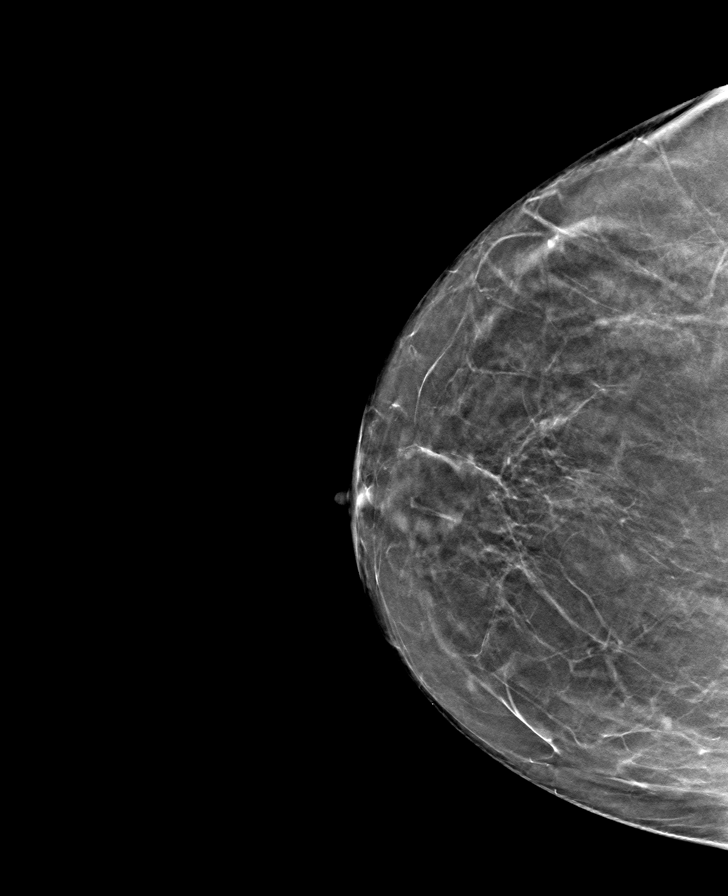

[L CC tomo · tomo slice 35/70.0]
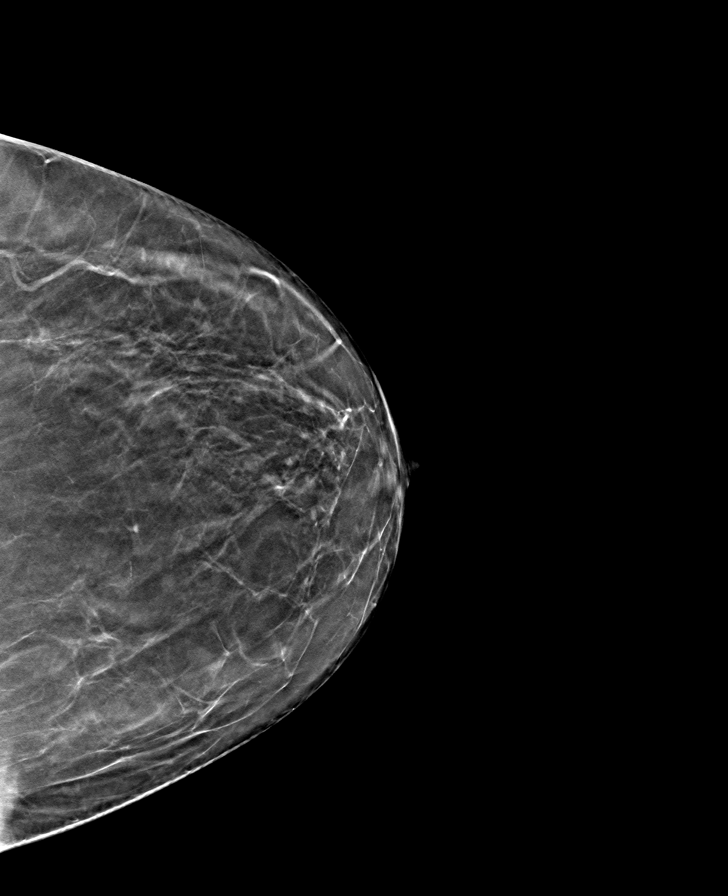

[L MLO tomo · tomo slice 39/77.0]
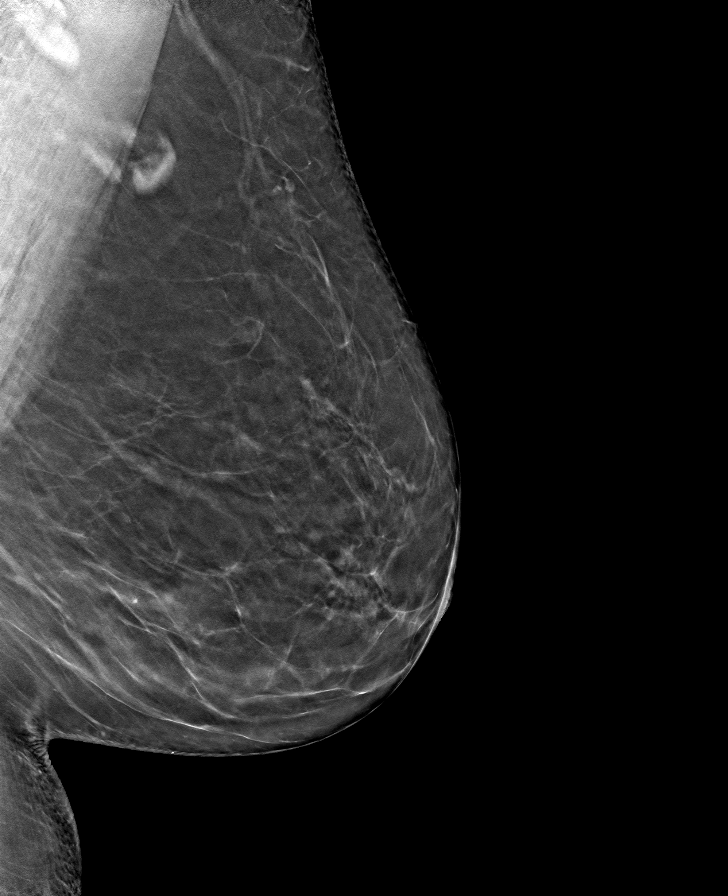

[R MLO tomo · tomo slice 37/72.0]
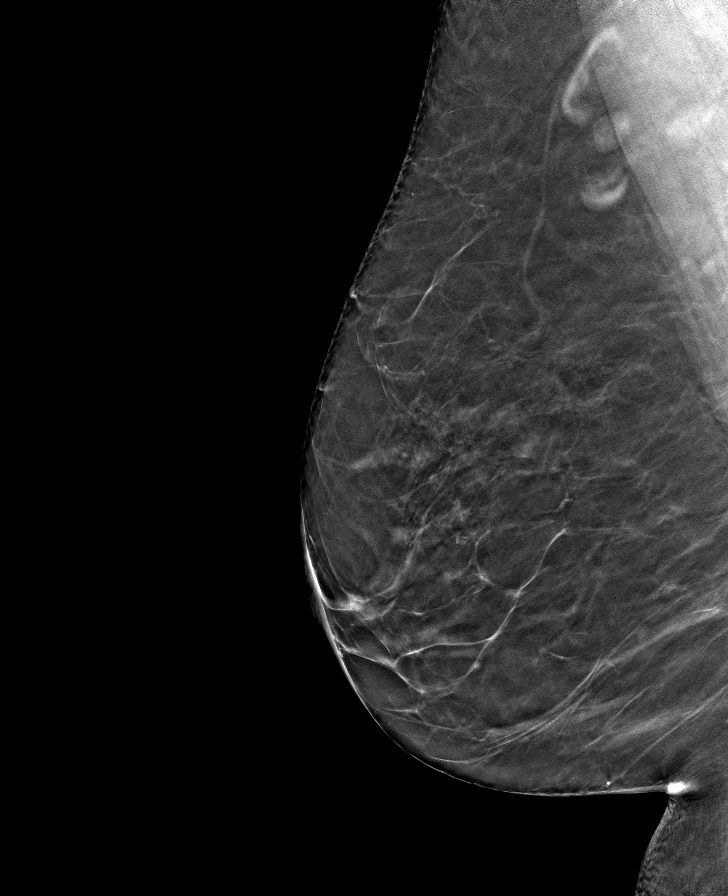

[8 of 24 positions shown; findings below may reference images not displayed]

ACR Breast Density Category b: There are scattered areas of
fibroglandular density.
FINDINGS: There are no findings suspicious for malignancy.
IMPRESSION: No mammographic evidence of malignancy. A result letter of this
screening mammogram will be mailed directly to the patient.

RECOMMENDATION:
Screening mammogram in one year. (Code:51-O-LD2)

BI-RADS CATEGORY  1: Negative.

## 2023-01-09 ENCOUNTER — Other Ambulatory Visit: Payer: Self-pay | Admitting: Family Medicine

## 2023-01-09 DIAGNOSIS — Z1231 Encounter for screening mammogram for malignant neoplasm of breast: Secondary | ICD-10-CM

## 2023-01-24 ENCOUNTER — Ambulatory Visit
Admission: RE | Admit: 2023-01-24 | Discharge: 2023-01-24 | Disposition: A | Discharge status: Home / Self Care | Payer: BC Managed Care – PPO | Source: Ambulatory Visit | Attending: Family Medicine | Admitting: Family Medicine

## 2023-01-24 DIAGNOSIS — Z1231 Encounter for screening mammogram for malignant neoplasm of breast: Secondary | ICD-10-CM | POA: Insufficient documentation

## 2024-07-16 ENCOUNTER — Ambulatory Visit: Admit: 2024-07-16 | Admitting: Internal Medicine
